# Patient Record
Sex: Male | Born: 1990 | Race: Black or African American | Hispanic: No | Marital: Single
Health system: Southern US, Community
[De-identification: ages and names within clinical notes are randomized; demographics above are authoritative.]

## PROBLEM LIST (undated history)

## (undated) DIAGNOSIS — S41132A Puncture wound without foreign body of left upper arm, initial encounter: Secondary | ICD-10-CM

## (undated) DIAGNOSIS — W3400XA Accidental discharge from unspecified firearms or gun, initial encounter: Secondary | ICD-10-CM

---
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
## 2011-12-25 ENCOUNTER — Emergency Department (HOSPITAL_COMMUNITY)
Admission: EM | Admit: 2011-12-25 | Discharge: 2011-12-26 | Disposition: A | Discharge status: Home / Self Care | Payer: Medicaid Other | Attending: Emergency Medicine | Admitting: Emergency Medicine

## 2011-12-25 ENCOUNTER — Encounter (HOSPITAL_COMMUNITY): Payer: Self-pay | Admitting: *Deleted

## 2011-12-25 ENCOUNTER — Emergency Department (HOSPITAL_COMMUNITY): Payer: Medicaid Other

## 2011-12-25 DIAGNOSIS — S41109A Unspecified open wound of unspecified upper arm, initial encounter: Secondary | ICD-10-CM | POA: Insufficient documentation

## 2011-12-25 DIAGNOSIS — S41139A Puncture wound without foreign body of unspecified upper arm, initial encounter: Secondary | ICD-10-CM

## 2011-12-25 DIAGNOSIS — Y92410 Unspecified street and highway as the place of occurrence of the external cause: Secondary | ICD-10-CM | POA: Insufficient documentation

## 2011-12-25 LAB — BASIC METABOLIC PANEL
BUN: 12 mg/dL (ref 6–23)
CO2: 25 mEq/L (ref 19–32)
Calcium: 9.4 mg/dL (ref 8.4–10.5)
Chloride: 102 mEq/L (ref 96–112)
GFR calc Af Amer: >90 mL/min (ref >90)
Sodium: 139 mEq/L (ref 135–145)

## 2011-12-25 LAB — CBC WITH DIFFERENTIAL/PLATELET
Basophils Absolute: 0 10*3/uL (ref 0.0–0.1)
Basophils Relative: 0 % (ref 0–1)
Lymphs Abs: 3.5 10*3/uL (ref 0.7–4.0)
MCH: 31.1 pg (ref 26.0–34.0)
MCHC: 34.7 g/dL (ref 30.0–36.0)
Neutrophils Relative %: 58 % (ref 43–77)
Platelets: 339 10*3/uL (ref 150–400)
RBC: 4.79 MIL/uL (ref 4.22–5.81)

## 2011-12-25 MED ORDER — SODIUM CHLORIDE 0.9 % IV BOLUS (SEPSIS)
1000.0000 mL | Freq: Once | INTRAVENOUS | Status: AC
  Administered 2011-12-25: 1000 mL via INTRAVENOUS

## 2011-12-25 MED ORDER — CEPHALEXIN 500 MG PO CAPS: 500.0000 mg | ORAL_CAPSULE | Freq: Four times a day (QID) | ORAL | Status: AC

## 2011-12-25 MED ORDER — CEFAZOLIN SODIUM 1-5 GM-% IV SOLN
1.0000 g | Freq: Once | INTRAVENOUS | Status: AC
  Administered 2011-12-25: 1 g via INTRAVENOUS
  Filled 2011-12-25: qty 50

## 2011-12-25 MED ORDER — OXYCODONE-ACETAMINOPHEN 5-325 MG PO TABS: 1.0000 | ORAL_TABLET | Freq: Four times a day (QID) | ORAL | Status: DC | PRN

## 2011-12-25 MED ORDER — ONDANSETRON HCL 4 MG/2ML IJ SOLN
4.0000 mg | Freq: Once | INTRAMUSCULAR | Status: AC
  Administered 2011-12-25: 4 mg via INTRAVENOUS
  Filled 2011-12-25: qty 2

## 2011-12-25 MED ORDER — HYDROMORPHONE HCL PF 1 MG/ML IJ SOLN
1.0000 mg | Freq: Once | INTRAMUSCULAR | Status: AC
  Administered 2011-12-25: 1 mg via INTRAVENOUS
  Filled 2011-12-25: qty 1
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
## 2011-12-29 ENCOUNTER — Emergency Department (INDEPENDENT_AMBULATORY_CARE_PROVIDER_SITE_OTHER)
Admission: EM | Admit: 2011-12-29 | Discharge: 2011-12-29 | Disposition: A | Discharge status: Home / Self Care | Payer: Self-pay | Source: Home / Self Care | Attending: Family Medicine | Admitting: Family Medicine

## 2011-12-29 ENCOUNTER — Encounter (HOSPITAL_COMMUNITY): Payer: Self-pay

## 2011-12-29 DIAGNOSIS — S41109A Unspecified open wound of unspecified upper arm, initial encounter: Secondary | ICD-10-CM

## 2011-12-29 DIAGNOSIS — W3400XA Accidental discharge from unspecified firearms or gun, initial encounter: Secondary | ICD-10-CM

## 2011-12-29 DIAGNOSIS — S41139A Puncture wound without foreign body of unspecified upper arm, initial encounter: Secondary | ICD-10-CM

## 2011-12-29 HISTORY — DX: Accidental discharge from unspecified firearms or gun, initial encounter: W34.00XA

## 2011-12-29 HISTORY — DX: Puncture wound without foreign body of left upper arm, initial encounter: S41.132A

## 2011-12-29 MED ORDER — IBUPROFEN 600 MG PO TABS: 600.0000 mg | ORAL_TABLET | Freq: Three times a day (TID) | ORAL | Status: AC | PRN

## 2011-12-29 MED ORDER — TRAMADOL HCL 50 MG PO TABS
50.0000 mg | ORAL_TABLET | Freq: Three times a day (TID) | ORAL | Status: AC | PRN
Start: 2011-12-29 — End: 2012-01-08
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
## 2011-12-29 NOTE — ED Notes (Signed)
Dry sterile dressing applied by Madaline Guthrie, EMT
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
## 2012-09-06 ENCOUNTER — Encounter (HOSPITAL_COMMUNITY): Payer: Self-pay | Admitting: Cardiology

## 2012-09-06 ENCOUNTER — Emergency Department (HOSPITAL_COMMUNITY)
Admission: EM | Admit: 2012-09-06 | Discharge: 2012-09-06 | Disposition: A | Discharge status: Home / Self Care | Payer: Self-pay | Attending: Emergency Medicine | Admitting: Emergency Medicine

## 2012-09-06 ENCOUNTER — Emergency Department (HOSPITAL_COMMUNITY): Payer: Self-pay

## 2012-09-06 DIAGNOSIS — F17200 Nicotine dependence, unspecified, uncomplicated: Secondary | ICD-10-CM | POA: Insufficient documentation

## 2012-09-06 DIAGNOSIS — F1410 Cocaine abuse, uncomplicated: Secondary | ICD-10-CM | POA: Insufficient documentation

## 2012-09-06 LAB — COMPREHENSIVE METABOLIC PANEL
AST: 33 U/L (ref 0–37)
Albumin: 4.1 g/dL (ref 3.5–5.2)
Alkaline Phosphatase: 64 U/L (ref 39–117)
BUN: 10 mg/dL (ref 6–23)
GFR calc Af Amer: >90 mL/min (ref >90)
GFR calc non Af Amer: 83 mL/min — ABNORMAL LOW (ref >90)
Potassium: 4.2 mEq/L (ref 3.5–5.1)
Sodium: 137 mEq/L (ref 135–145)
Total Bilirubin: 0.3 mg/dL (ref 0.3–1.2)
Total Protein: 8.3 g/dL (ref 6.0–8.3)

## 2012-09-06 LAB — CBC WITH DIFFERENTIAL/PLATELET
Basophils Absolute: 0.1 10*3/uL (ref 0.0–0.1)
Basophils Relative: 1 % (ref 0–1)
Eosinophils Relative: 1 % (ref 0–5)
HCT: 43 % (ref 39.0–52.0)
MCH: 31 pg (ref 26.0–34.0)
MCV: 85 fL (ref 78.0–100.0)
Monocytes Absolute: 0.7 10*3/uL (ref 0.1–1.0)
RBC: 5.06 MIL/uL (ref 4.22–5.81)
WBC: 7.9 10*3/uL (ref 4.0–10.5)

## 2012-09-06 LAB — RAPID URINE DRUG SCREEN, HOSP PERFORMED: Amphetamines: NOT DETECTED

## 2012-09-06 LAB — POCT I-STAT TROPONIN I: Troponin i, poc: 0.02 ng/mL (ref 0.00–0.08)
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
## 2012-09-06 NOTE — ED Notes (Signed)
Pt denies any complaints at this time. Denies CP, SOB, N/V.
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
## 2015-05-24 ENCOUNTER — Emergency Department (HOSPITAL_COMMUNITY)
Admission: EM | Admit: 2015-05-24 | Discharge: 2015-05-24 | Disposition: A | Discharge status: Home / Self Care | Payer: Medicaid Other | Attending: Emergency Medicine | Admitting: Emergency Medicine

## 2015-05-24 ENCOUNTER — Encounter (HOSPITAL_COMMUNITY): Payer: Self-pay | Admitting: Emergency Medicine

## 2015-05-24 DIAGNOSIS — R1110 Vomiting, unspecified: Secondary | ICD-10-CM | POA: Insufficient documentation

## 2015-05-24 DIAGNOSIS — J209 Acute bronchitis, unspecified: Secondary | ICD-10-CM

## 2015-05-24 DIAGNOSIS — F17200 Nicotine dependence, unspecified, uncomplicated: Secondary | ICD-10-CM | POA: Insufficient documentation

## 2015-05-24 DIAGNOSIS — Z87828 Personal history of other (healed) physical injury and trauma: Secondary | ICD-10-CM | POA: Insufficient documentation

## 2015-05-24 MED ORDER — PREDNISONE 20 MG PO TABS
60.0000 mg | ORAL_TABLET | Freq: Once | ORAL | Status: AC
  Administered 2015-05-24: 60 mg via ORAL
  Filled 2015-05-24: qty 3

## 2015-05-24 MED ORDER — ALBUTEROL SULFATE (2.5 MG/3ML) 0.083% IN NEBU
5.0000 mg | INHALATION_SOLUTION | Freq: Once | RESPIRATORY_TRACT | Status: AC
Start: 2015-05-24 — End: 2015-05-24
  Administered 2015-05-24: 5 mg via RESPIRATORY_TRACT
  Filled 2015-05-24: qty 6

## 2015-05-24 MED ORDER — AMOXICILLIN 500 MG PO CAPS: 500.0000 mg | ORAL_CAPSULE | Freq: Three times a day (TID) | ORAL | Status: DC

## 2015-05-24 MED ORDER — ALBUTEROL SULFATE HFA 108 (90 BASE) MCG/ACT IN AERS
2.0000 | INHALATION_SPRAY | RESPIRATORY_TRACT | Status: DC | PRN
  Administered 2015-05-24: 2 via RESPIRATORY_TRACT
  Filled 2015-05-24: qty 6.7

## 2015-05-24 MED ORDER — PREDNISONE 20 MG PO TABS: 60.0000 mg | ORAL_TABLET | Freq: Every day | ORAL | Status: DC
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
## 2015-05-24 NOTE — Discharge Instructions (Signed)

## 2015-05-24 NOTE — ED Notes (Signed)
Reviewed the use of an inhaler, discharge instruction and prescription reviewed voiced understanding

## 2015-05-24 NOTE — ED Notes (Signed)
Pt states he developed a cough about a week ago. Pt stated that yesterday he vomited once. Pt denies any chest or abdominal pain.

## 2016-07-10 ENCOUNTER — Emergency Department (HOSPITAL_COMMUNITY)
Admission: EM | Admit: 2016-07-10 | Discharge: 2016-07-10 | Disposition: A | Discharge status: Home / Self Care | Payer: Medicaid Other | Attending: Emergency Medicine | Admitting: Emergency Medicine

## 2016-07-10 ENCOUNTER — Encounter (HOSPITAL_COMMUNITY): Payer: Self-pay | Admitting: *Deleted

## 2016-07-10 DIAGNOSIS — R112 Nausea with vomiting, unspecified: Secondary | ICD-10-CM | POA: Insufficient documentation

## 2016-07-10 LAB — URINALYSIS, ROUTINE W REFLEX MICROSCOPIC
Bacteria, UA: NONE SEEN
Bilirubin Urine: NEGATIVE
Glucose, UA: NEGATIVE mg/dL
Hgb urine dipstick: NEGATIVE
Ketones, ur: 20 mg/dL — AB
Leukocytes, UA: NEGATIVE
Nitrite: NEGATIVE
Protein, ur: 100 mg/dL — AB
Specific Gravity, Urine: 1.024 (ref 1.005–1.030)
pH: 5 (ref 5.0–8.0)

## 2016-07-10 LAB — COMPREHENSIVE METABOLIC PANEL
ALT: 25 U/L (ref 17–63)
AST: 26 U/L (ref 15–41)
Albumin: 4.4 g/dL (ref 3.5–5.0)
Alkaline Phosphatase: 56 U/L (ref 38–126)
Anion gap: 8 (ref 5–15)
BUN: 16 mg/dL (ref 6–20)
CO2: 24 mmol/L (ref 22–32)
Calcium: 9.3 mg/dL (ref 8.9–10.3)
Chloride: 105 mmol/L (ref 101–111)
Creatinine, Ser: 1.22 mg/dL (ref 0.61–1.24)
GFR calc Af Amer: >60 mL/min (ref >60)
GFR calc non Af Amer: >60 mL/min (ref >60)
Glucose, Bld: 104 mg/dL — ABNORMAL HIGH (ref 65–99)
Potassium: 4.2 mmol/L (ref 3.5–5.1)
Sodium: 137 mmol/L (ref 135–145)
Total Bilirubin: 0.7 mg/dL (ref 0.3–1.2)
Total Protein: 8.6 g/dL — ABNORMAL HIGH (ref 6.5–8.1)

## 2016-07-10 LAB — CBC
HCT: 44.9 % (ref 39.0–52.0)
Hemoglobin: 15.7 g/dL (ref 13.0–17.0)
MCH: 31.3 pg (ref 26.0–34.0)
MCHC: 35 g/dL (ref 30.0–36.0)
MCV: 89.6 fL (ref 78.0–100.0)
Platelets: 303 10*3/uL (ref 150–400)
RBC: 5.01 MIL/uL (ref 4.22–5.81)
RDW: 13 % (ref 11.5–15.5)
WBC: 9.2 10*3/uL (ref 4.0–10.5)

## 2016-07-10 LAB — LIPASE, BLOOD: Lipase: 18 U/L (ref 11–51)

## 2016-07-10 MED ORDER — ONDANSETRON HCL 4 MG PO TABS: 4.0000 mg | ORAL_TABLET | Freq: Three times a day (TID) | ORAL | 0 refills | Status: AC | PRN

## 2016-07-10 MED ORDER — SODIUM CHLORIDE 0.9 % IV BOLUS (SEPSIS)
1000.0000 mL | Freq: Once | INTRAVENOUS | Status: AC
  Administered 2016-07-10: 1000 mL via INTRAVENOUS

## 2016-07-10 MED ORDER — PROMETHAZINE HCL 25 MG/ML IJ SOLN
12.5000 mg | Freq: Once | INTRAMUSCULAR | Status: AC
  Administered 2016-07-10: 12.5 mg via INTRAVENOUS
  Filled 2016-07-10: qty 1

## 2016-07-10 NOTE — ED Triage Notes (Signed)
Pt reports mild abd pain with n/v and chills which started around 0800.  Vomited x 5.  Reports decreased appetite.

## 2016-07-10 NOTE — ED Triage Notes (Signed)
Per EMS patient complains of flu like symptoms since 0800.  Patient complains of chills and vomiting blood.  EMS noted not witnessing blood in emesis.  Patient denied SOB or Chest Pain.
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
## 2016-07-10 NOTE — ED Notes (Signed)
PT DISCHARGED. INSTRUCTIONS AND PRESCRIPTION GIVEN. AAOX4. PT IN NO APPARENT DISTRESS OR PAIN. THE OPPORTUNITY TO ASK QUESTIONS WAS PROVIDED. 

## 2016-07-15 ENCOUNTER — Encounter (HOSPITAL_COMMUNITY): Payer: Self-pay | Admitting: *Deleted

## 2016-07-15 ENCOUNTER — Emergency Department (HOSPITAL_COMMUNITY): Payer: Self-pay

## 2016-07-15 ENCOUNTER — Emergency Department (HOSPITAL_COMMUNITY)
Admission: EM | Admit: 2016-07-15 | Discharge: 2016-07-15 | Disposition: A | Discharge status: Home / Self Care | Payer: Self-pay | Attending: Emergency Medicine | Admitting: Emergency Medicine

## 2016-07-15 DIAGNOSIS — Y999 Unspecified external cause status: Secondary | ICD-10-CM | POA: Insufficient documentation

## 2016-07-15 DIAGNOSIS — R791 Abnormal coagulation profile: Secondary | ICD-10-CM | POA: Insufficient documentation

## 2016-07-15 DIAGNOSIS — Y92009 Unspecified place in unspecified non-institutional (private) residence as the place of occurrence of the external cause: Secondary | ICD-10-CM | POA: Insufficient documentation

## 2016-07-15 DIAGNOSIS — R931 Abnormal findings on diagnostic imaging of heart and coronary circulation: Secondary | ICD-10-CM | POA: Insufficient documentation

## 2016-07-15 DIAGNOSIS — Y939 Activity, unspecified: Secondary | ICD-10-CM | POA: Insufficient documentation

## 2016-07-15 DIAGNOSIS — W3400XA Accidental discharge from unspecified firearms or gun, initial encounter: Secondary | ICD-10-CM | POA: Insufficient documentation

## 2016-07-15 DIAGNOSIS — S31000A Unspecified open wound of lower back and pelvis without penetration into retroperitoneum, initial encounter: Secondary | ICD-10-CM | POA: Insufficient documentation

## 2016-07-15 DIAGNOSIS — F17200 Nicotine dependence, unspecified, uncomplicated: Secondary | ICD-10-CM | POA: Insufficient documentation

## 2016-07-15 LAB — CBC
HEMATOCRIT: 43 % (ref 39.0–52.0)
HEMOGLOBIN: 14.4 g/dL (ref 13.0–17.0)
MCH: 30.7 pg (ref 26.0–34.0)
MCHC: 33.5 g/dL (ref 30.0–36.0)
MCV: 91.7 fL (ref 78.0–100.0)
Platelets: 302 10*3/uL (ref 150–400)
RBC: 4.69 MIL/uL (ref 4.22–5.81)
RDW: 13.3 % (ref 11.5–15.5)
WBC: 9.4 10*3/uL (ref 4.0–10.5)

## 2016-07-15 LAB — COMPREHENSIVE METABOLIC PANEL
ALK PHOS: 49 U/L (ref 38–126)
ALT: 24 U/L (ref 17–63)
ANION GAP: 10 (ref 5–15)
AST: 26 U/L (ref 15–41)
Albumin: 4.1 g/dL (ref 3.5–5.0)
BUN: 13 mg/dL (ref 6–20)
CALCIUM: 8.7 mg/dL — AB (ref 8.9–10.3)
CO2: 23 mmol/L (ref 22–32)
Chloride: 103 mmol/L (ref 101–111)
Creatinine, Ser: 1.28 mg/dL — ABNORMAL HIGH (ref 0.61–1.24)
GFR calc non Af Amer: >60 mL/min (ref >60)
Glucose, Bld: 107 mg/dL — ABNORMAL HIGH (ref 65–99)
POTASSIUM: 3.5 mmol/L (ref 3.5–5.1)
SODIUM: 136 mmol/L (ref 135–145)
Total Bilirubin: 0.2 mg/dL — ABNORMAL LOW (ref 0.3–1.2)
Total Protein: 7.2 g/dL (ref 6.5–8.1)

## 2016-07-15 LAB — PROTIME-INR
INR: 1.01
Prothrombin Time: 13.3 seconds (ref 11.4–15.2)

## 2016-07-15 LAB — ABO/RH: ABO/RH(D): O POS

## 2016-07-15 LAB — CDS SEROLOGY

## 2016-07-15 LAB — ETHANOL: ALCOHOL ETHYL (B): 46 mg/dL — AB (ref <5)

## 2016-07-15 MED ORDER — IOPAMIDOL (ISOVUE-300) INJECTION 61%
INTRAVENOUS | Status: AC
  Administered 2016-07-15: 100 mL
  Filled 2016-07-15: qty 100

## 2016-07-15 MED ORDER — MORPHINE SULFATE (PF) 4 MG/ML IV SOLN
4.0000 mg | Freq: Once | INTRAVENOUS | Status: AC
  Administered 2016-07-15: 4 mg via INTRAVENOUS
  Filled 2016-07-15: qty 1

## 2016-07-15 MED ORDER — SODIUM CHLORIDE 0.9 % IV SOLN
Freq: Once | INTRAVENOUS | Status: AC
  Administered 2016-07-15: 19:00:00 via INTRAVENOUS
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
## 2016-07-15 NOTE — ED Notes (Signed)
Family at bedside, requested for EDP to speak with pt and family to give update on results. Detective also at bedside

## 2016-07-15 NOTE — ED Notes (Signed)
Mother and father in waiting room

## 2016-07-15 NOTE — ED Notes (Signed)
Waiting for CSI then pt ready for DC
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
## 2016-07-16 ENCOUNTER — Encounter (HOSPITAL_COMMUNITY): Payer: Self-pay | Admitting: *Deleted

## 2016-07-16 LAB — BPAM RBC
BLOOD PRODUCT EXPIRATION DATE: 201804032359
Blood Product Expiration Date: 201804022359
ISSUE DATE / TIME: 201803131811
ISSUE DATE / TIME: 201803131811
UNIT TYPE AND RH: 9500
UNIT TYPE AND RH: 9500

## 2016-07-16 LAB — TYPE AND SCREEN
ABO/RH(D): O POS
Antibody Screen: NEGATIVE
UNIT DIVISION: 0
Unit division: 0

## 2016-07-16 LAB — BPAM FFP
BLOOD PRODUCT EXPIRATION DATE: 201804032359
Blood Product Expiration Date: 201804032359
ISSUE DATE / TIME: 201803131813
ISSUE DATE / TIME: 201803131813
Unit Type and Rh: 600
Unit Type and Rh: 6200

## 2016-07-16 LAB — PREPARE FRESH FROZEN PLASMA
UNIT DIVISION: 0
Unit division: 0

## 2016-11-06 ENCOUNTER — Ambulatory Visit (HOSPITAL_COMMUNITY)
Admission: EM | Admit: 2016-11-06 | Discharge: 2016-11-06 | Disposition: A | Discharge status: Home / Self Care | Payer: Medicaid Other | Attending: Family Medicine | Admitting: Family Medicine

## 2016-11-06 ENCOUNTER — Encounter (HOSPITAL_COMMUNITY): Payer: Self-pay | Admitting: Family Medicine

## 2016-11-06 DIAGNOSIS — B372 Candidiasis of skin and nail: Secondary | ICD-10-CM

## 2016-11-06 DIAGNOSIS — B354 Tinea corporis: Secondary | ICD-10-CM

## 2016-11-06 MED ORDER — FLUCONAZOLE 200 MG PO TABS
200.0000 mg | ORAL_TABLET | Freq: Every day | ORAL | 0 refills | Status: AC
Start: 2016-11-06 — End: 2016-11-13

## 2016-11-06 MED ORDER — NYSTATIN 100000 UNIT/GM EX CREA: TOPICAL_CREAM | CUTANEOUS | 1 refills | Status: AC

## 2016-11-06 NOTE — ED Triage Notes (Signed)
[facility] and under breast x 1 month.

## 2016-11-07 NOTE — ED Provider Notes (Signed)
  [facility]   [id number] 11/06/16 Arrival Time: 1914  ASSESSMENT & PLAN:  Today you were diagnosed with the following: 1. Tinea of the body   2. Intertriginous candidiasis    You have been prescribed prescription medications this visit  Please take and complete all medications as prescribed.  If you are not improving over the next few days or feel you are worsening please follow up here or the Emergency Department if you are unable to see your regular doctor.  Meds ordered this encounter  Medications  . fluconazole (DIFLUCAN) 200 MG tablet    Sig: Take 1 tablet (200 mg total) by mouth daily.    Dispense:  14 tablet    Refill:  0  . nystatin cream (MYCOSTATIN)    Sig: Apply to affected area 2 times daily    Dispense:  60 g    Refill:  1   Try to keep skin clean and dry. Reviewed expectations re: course of current medical issues. Questions answered. Outlined signs and symptoms indicating need for more acute intervention. Patient verbalized understanding. After Visit Summary given.   SUBJECTIVE:  Cody Henry is a 26 y.o. male who presents with complaint of itchy rash in creases on abdomen and on groin mostly. For one month. Gradual onset. No self treatment. Rash is painful sometimes. No new exposures. Afebrile. No h/o similar.  ROS: As per HPI.   OBJECTIVE:  Vitals:   11/06/16 1957  BP: 125/78  Pulse: 79  Resp: 18  Temp: 98.3 F (36.8 C)  SpO2: 98%     General appearance: alert; no distress Skin: areas of erythematous rash with confluence in groin and in creases on abdomen; moist; irregular borders and slightly raised; tender to touch in some areas  No Known Allergies  PMHx, SurgHx, SocialHx, Medications, and Allergies were reviewed in the Visit Navigator and updated as appropriate.      Mardella LaymanHagler, Cody Melander, MD 11/07/16 [phone]

## 2016-11-30 ENCOUNTER — Emergency Department (HOSPITAL_COMMUNITY)
Admission: EM | Admit: 2016-11-30 | Discharge: 2016-11-30 | Disposition: A | Discharge status: Home / Self Care | Payer: No Typology Code available for payment source | Attending: Emergency Medicine | Admitting: Emergency Medicine

## 2016-11-30 ENCOUNTER — Encounter (HOSPITAL_COMMUNITY): Payer: Self-pay | Admitting: Emergency Medicine

## 2016-11-30 ENCOUNTER — Emergency Department (HOSPITAL_COMMUNITY): Payer: No Typology Code available for payment source

## 2016-11-30 DIAGNOSIS — Z79899 Other long term (current) drug therapy: Secondary | ICD-10-CM | POA: Insufficient documentation

## 2016-11-30 DIAGNOSIS — S3992XA Unspecified injury of lower back, initial encounter: Secondary | ICD-10-CM | POA: Diagnosis present

## 2016-11-30 DIAGNOSIS — M25561 Pain in right knee: Secondary | ICD-10-CM | POA: Insufficient documentation

## 2016-11-30 DIAGNOSIS — Y92410 Unspecified street and highway as the place of occurrence of the external cause: Secondary | ICD-10-CM | POA: Insufficient documentation

## 2016-11-30 DIAGNOSIS — F17200 Nicotine dependence, unspecified, uncomplicated: Secondary | ICD-10-CM | POA: Insufficient documentation

## 2016-11-30 DIAGNOSIS — Y999 Unspecified external cause status: Secondary | ICD-10-CM | POA: Diagnosis not present

## 2016-11-30 DIAGNOSIS — S39012A Strain of muscle, fascia and tendon of lower back, initial encounter: Secondary | ICD-10-CM | POA: Diagnosis not present

## 2016-11-30 DIAGNOSIS — Y939 Activity, unspecified: Secondary | ICD-10-CM | POA: Insufficient documentation

## 2016-11-30 MED ORDER — NAPROXEN 375 MG PO TABS: 375.0000 mg | ORAL_TABLET | Freq: Two times a day (BID) | ORAL | 0 refills | Status: AC

## 2016-11-30 MED ORDER — CYCLOBENZAPRINE HCL 10 MG PO TABS: 10.0000 mg | ORAL_TABLET | Freq: Three times a day (TID) | ORAL | 0 refills | Status: DC | PRN

## 2016-11-30 MED ORDER — NAPROXEN 250 MG PO TABS
375.0000 mg | ORAL_TABLET | Freq: Once | ORAL | Status: AC
  Administered 2016-11-30: 375 mg via ORAL
  Filled 2016-11-30: qty 2
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
## 2016-11-30 NOTE — ED Notes (Signed)
Pt called for triage x 1, no answer. 

## 2016-11-30 NOTE — Progress Notes (Signed)
Orthopedic Tech Progress Note Patient Details:  Dorene SorrowMelquan L Mol 06/29/1990 [id number]  Ortho Devices Type of Ortho Device: Knee Immobilizer, Crutches Ortho Device/Splint Interventions: Application   Saul FordyceJennifer C Cesar Alf 11/30/2016, 4:38 PM
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
## 2016-11-30 NOTE — Discharge Instructions (Signed)
X-ray of your back was normal. Your knee x-ray shows some fluid in the joint. Please use the knee immobilizer, use the crutches for weightbearing as tolerated.  Please rest, ice, compress and elevated the affected body part to help with swelling and pain.  Please take the Naproxen as prescribed for pain. Do not take any additional NSAIDs including Motrin, Aleve, Ibuprofen, Advil.  Please the the Flexeril for muscle relaxation. This medication will make you drowsy so avoid situation that could place you in danger.   If symptoms are not improving after 4-5 days made a follow-up with orthopedist had given your referral.  Warm soaks in Epsom salt and warm compresses to her back will help with the pain.
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
## 2017-02-02 ENCOUNTER — Encounter (HOSPITAL_COMMUNITY): Payer: Self-pay | Admitting: *Deleted

## 2017-02-02 ENCOUNTER — Ambulatory Visit (HOSPITAL_COMMUNITY)
Admission: EM | Admit: 2017-02-02 | Discharge: 2017-02-02 | Disposition: A | Discharge status: Home / Self Care | Payer: Self-pay | Attending: Internal Medicine | Admitting: Internal Medicine

## 2017-02-02 DIAGNOSIS — K0889 Other specified disorders of teeth and supporting structures: Secondary | ICD-10-CM

## 2017-02-02 MED ORDER — PENICILLIN V POTASSIUM 500 MG PO TABS: 500.0000 mg | ORAL_TABLET | Freq: Four times a day (QID) | ORAL | 0 refills | Status: AC

## 2017-02-02 MED ORDER — MELOXICAM 15 MG PO TABS: 15.0000 mg | ORAL_TABLET | Freq: Every day | ORAL | 0 refills | Status: AC

## 2017-02-02 MED ORDER — BENZOCAINE 10 % MT GEL: 1.0000 "application " | OROMUCOSAL | 0 refills | Status: AC | PRN

## 2017-02-02 NOTE — ED Triage Notes (Signed)
Patient reports 3 day history of right lower dental pain

## 2017-02-02 NOTE — Discharge Instructions (Signed)
Start Penicillin as directed for dental abscess. Mobic and oragel for pain. Follow up with dentist for further treatment and evaluation. If experiencing swelling of the throat, trouble breathing, trouble swallowing, follow up for reevaluation.  ° °
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
## 2017-03-16 ENCOUNTER — Emergency Department (HOSPITAL_COMMUNITY)
Admission: EM | Admit: 2017-03-16 | Discharge: 2017-03-17 | Disposition: A | Discharge status: Home / Self Care | Payer: No Typology Code available for payment source | Attending: Emergency Medicine | Admitting: Emergency Medicine

## 2017-03-16 ENCOUNTER — Other Ambulatory Visit: Payer: Self-pay

## 2017-03-16 ENCOUNTER — Encounter (HOSPITAL_COMMUNITY): Payer: Self-pay | Admitting: Emergency Medicine

## 2017-03-16 DIAGNOSIS — F17200 Nicotine dependence, unspecified, uncomplicated: Secondary | ICD-10-CM | POA: Diagnosis not present

## 2017-03-16 DIAGNOSIS — M545 Low back pain, unspecified: Secondary | ICD-10-CM

## 2017-03-16 DIAGNOSIS — M25561 Pain in right knee: Secondary | ICD-10-CM | POA: Diagnosis not present

## 2017-03-16 DIAGNOSIS — Z79899 Other long term (current) drug therapy: Secondary | ICD-10-CM | POA: Insufficient documentation
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
## 2017-03-16 NOTE — ED Notes (Signed)
Pt over to the vending machines.

## 2017-03-17 ENCOUNTER — Emergency Department (HOSPITAL_COMMUNITY): Payer: No Typology Code available for payment source

## 2017-03-17 MED ORDER — IBUPROFEN 600 MG PO TABS: 600.0000 mg | ORAL_TABLET | Freq: Four times a day (QID) | ORAL | 0 refills | Status: AC | PRN

## 2017-03-17 MED ORDER — CYCLOBENZAPRINE HCL 10 MG PO TABS: 10.0000 mg | ORAL_TABLET | Freq: Two times a day (BID) | ORAL | 0 refills | Status: AC | PRN

## 2017-03-17 NOTE — Discharge Instructions (Signed)
Take Ibuprofen three times daily for the next week. Take this medicine with food. °Take muscle relaxer at bedtime to help you sleep. This medicine makes you drowsy so do not take before driving or work °Use a heating pad for sore muscles - use for 20 minutes several times a day °Return for worsening symptoms ° °
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
## 2017-08-10 ENCOUNTER — Ambulatory Visit (HOSPITAL_COMMUNITY)
Admission: EM | Admit: 2017-08-10 | Discharge: 2017-08-10 | Disposition: A | Discharge status: Home / Self Care | Payer: Self-pay | Attending: Family Medicine | Admitting: Family Medicine

## 2017-08-10 ENCOUNTER — Encounter (HOSPITAL_COMMUNITY): Payer: Self-pay | Admitting: Emergency Medicine

## 2017-08-10 DIAGNOSIS — B372 Candidiasis of skin and nail: Secondary | ICD-10-CM

## 2017-08-10 MED ORDER — FLUCONAZOLE 200 MG PO TABS: 200.0000 mg | ORAL_TABLET | Freq: Every day | ORAL | 0 refills | Status: AC

## 2017-08-10 NOTE — ED Triage Notes (Signed)
Pt here for rash to arm; pt sts hx of same in past
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
## 2017-08-10 NOTE — Discharge Instructions (Signed)
You may also use over the counter clotrimazole anti-fungal cream twice daily when you see this rash in the future.
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
## 2018-10-18 ENCOUNTER — Other Ambulatory Visit: Payer: Self-pay

## 2018-10-18 ENCOUNTER — Encounter (HOSPITAL_COMMUNITY): Payer: Self-pay

## 2018-10-18 ENCOUNTER — Emergency Department (HOSPITAL_COMMUNITY)
Admission: EM | Admit: 2018-10-18 | Discharge: 2018-10-18 | Disposition: A | Discharge status: Home / Self Care | Payer: No Typology Code available for payment source | Attending: Emergency Medicine | Admitting: Emergency Medicine

## 2018-10-18 ENCOUNTER — Emergency Department (HOSPITAL_COMMUNITY): Payer: No Typology Code available for payment source

## 2018-10-18 DIAGNOSIS — F17228 Nicotine dependence, chewing tobacco, with other nicotine-induced disorders: Secondary | ICD-10-CM | POA: Diagnosis not present

## 2018-10-18 DIAGNOSIS — Y998 Other external cause status: Secondary | ICD-10-CM | POA: Insufficient documentation

## 2018-10-18 DIAGNOSIS — Y92410 Unspecified street and highway as the place of occurrence of the external cause: Secondary | ICD-10-CM | POA: Insufficient documentation

## 2018-10-18 DIAGNOSIS — Y9389 Activity, other specified: Secondary | ICD-10-CM | POA: Diagnosis not present

## 2018-10-18 DIAGNOSIS — M545 Low back pain, unspecified: Secondary | ICD-10-CM

## 2018-10-18 DIAGNOSIS — F17200 Nicotine dependence, unspecified, uncomplicated: Secondary | ICD-10-CM | POA: Diagnosis not present

## 2018-10-18 DIAGNOSIS — R2232 Localized swelling, mass and lump, left upper limb: Secondary | ICD-10-CM | POA: Insufficient documentation

## 2018-10-18 DIAGNOSIS — M79645 Pain in left finger(s): Secondary | ICD-10-CM | POA: Insufficient documentation

## 2018-10-18 MED ORDER — ACETAMINOPHEN 325 MG PO TABS
650.0000 mg | ORAL_TABLET | Freq: Once | ORAL | Status: AC
  Administered 2018-10-18: 650 mg via ORAL
  Filled 2018-10-18: qty 2
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
## 2018-10-18 NOTE — Discharge Instructions (Addendum)
X-ray of your finger was negative for fracture.  Follow up with PCP if you continue to have symptoms.  Return to the ED for any new or worsening symptoms.

## 2018-10-18 NOTE — ED Notes (Signed)
One tablet dropped on the floor and retrieved a new tablet from pyxis.
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
## 2019-07-15 ENCOUNTER — Emergency Department (HOSPITAL_COMMUNITY)
Admission: EM | Admit: 2019-07-15 | Discharge: 2019-08-04 | Disposition: E | Payer: Self-pay | Attending: Emergency Medicine | Admitting: Emergency Medicine

## 2019-07-15 ENCOUNTER — Other Ambulatory Visit: Payer: Self-pay

## 2019-07-15 ENCOUNTER — Encounter (HOSPITAL_COMMUNITY): Payer: Self-pay | Admitting: Emergency Medicine

## 2019-07-15 DIAGNOSIS — Y929 Unspecified place or not applicable: Secondary | ICD-10-CM | POA: Insufficient documentation

## 2019-07-15 DIAGNOSIS — T07XXXA Unspecified multiple injuries, initial encounter: Secondary | ICD-10-CM

## 2019-07-15 DIAGNOSIS — Y939 Activity, unspecified: Secondary | ICD-10-CM | POA: Insufficient documentation

## 2019-07-15 DIAGNOSIS — S01402A Unspecified open wound of left cheek and temporomandibular area, initial encounter: Secondary | ICD-10-CM | POA: Insufficient documentation

## 2019-07-15 DIAGNOSIS — I469 Cardiac arrest, cause unspecified: Secondary | ICD-10-CM | POA: Insufficient documentation

## 2019-07-15 DIAGNOSIS — S21349A Puncture wound with foreign body of unspecified front wall of thorax with penetration into thoracic cavity, initial encounter: Secondary | ICD-10-CM | POA: Insufficient documentation

## 2019-07-15 DIAGNOSIS — W3400XA Accidental discharge from unspecified firearms or gun, initial encounter: Secondary | ICD-10-CM | POA: Insufficient documentation

## 2019-07-15 DIAGNOSIS — Y999 Unspecified external cause status: Secondary | ICD-10-CM | POA: Insufficient documentation

## 2019-07-15 LAB — BPAM FFP
Blood Product Expiration Date: 202103152359
Blood Product Expiration Date: 202103202359
ISSUE DATE / TIME: 202103121954
ISSUE DATE / TIME: 202103121954
Unit Type and Rh: 6200
Unit Type and Rh: 6200

## 2019-07-15 LAB — PREPARE FRESH FROZEN PLASMA
Unit division: 0
Unit division: 0

## 2019-07-15 LAB — I-STAT CHEM 8, ED
BUN: 13 mg/dL (ref 6–20)
Calcium, Ion: 1.05 mmol/L — ABNORMAL LOW (ref 1.15–1.40)
Chloride: 105 mmol/L (ref 98–111)
Creatinine, Ser: 1.9 mg/dL — ABNORMAL HIGH (ref 0.61–1.24)
Glucose, Bld: 209 mg/dL — ABNORMAL HIGH (ref 70–99)
HCT: 46 % (ref 39.0–52.0)
Hemoglobin: 15.6 g/dL (ref 13.0–17.0)
Potassium: 3.8 mmol/L (ref 3.5–5.1)
Sodium: 139 mmol/L (ref 135–145)
TCO2: 15 mmol/L — ABNORMAL LOW (ref 22–32)

## 2019-07-15 MED ORDER — SUCCINYLCHOLINE CHLORIDE 20 MG/ML IJ SOLN
INTRAMUSCULAR | Status: AC | PRN
  Administered 2019-07-15: 200 mg via INTRAVENOUS

## 2019-07-15 MED ORDER — ETOMIDATE 2 MG/ML IV SOLN
INTRAVENOUS | Status: AC | PRN
  Administered 2019-07-15: 20 mg via INTRAVENOUS

## 2019-07-15 MED ORDER — EPINEPHRINE 1 MG/10ML IJ SOSY
PREFILLED_SYRINGE | INTRAMUSCULAR | Status: AC | PRN
  Administered 2019-07-15: 1 mg via INTRAVENOUS

## 2019-07-16 LAB — BPAM RBC
Blood Product Expiration Date: 202103272359
Blood Product Expiration Date: 202103272359
ISSUE DATE / TIME: 202103121953
ISSUE DATE / TIME: 202103121953
Unit Type and Rh: 5100
Unit Type and Rh: 5100

## 2019-07-16 LAB — TYPE AND SCREEN
Unit division: 0
Unit division: 0

## 2019-07-18 ENCOUNTER — Encounter (HOSPITAL_COMMUNITY): Payer: Self-pay

## 2019-08-04 NOTE — ED Notes (Signed)
Paged ME @ 20:56 to [phone]
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
## 2019-08-04 NOTE — ED Notes (Signed)
2nd unit blood started on pressure bags.

## 2019-08-04 NOTE — ED Notes (Signed)
cpr stopped, pt pronounced death by Dr. Madilyn Hook.

## 2019-08-04 NOTE — ED Notes (Signed)
CPR started.
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
## 2019-08-04 NOTE — Progress Notes (Signed)
Chaplain supported family as doctor notified the family of death.  Lavone Neri Chaplain Resident For questions concerning this note please contact me by pager [phone]

## 2019-08-04 NOTE — ED Notes (Signed)
First unit blood started

## 2019-08-04 NOTE — ED Notes (Signed)
[place] donor notified Ref Num. [id number]. Hold for miracle. Sezy Alpha Gula

## 2019-08-04 DEATH — deceased
